# Patient Record
Sex: Male | Born: 1970 | Race: Black or African American | Hispanic: No | Marital: Married | Smoking: Never smoker
Health system: Southern US, Community
[De-identification: ages and names within clinical notes are randomized; demographics above are authoritative.]

## PROBLEM LIST (undated history)

## (undated) DIAGNOSIS — E119 Type 2 diabetes mellitus without complications: Secondary | ICD-10-CM

## (undated) HISTORY — PX: ABDOMINAL SURGERY: SHX537

---

## 2017-02-02 ENCOUNTER — Emergency Department (HOSPITAL_COMMUNITY)
Admission: EM | Admit: 2017-02-02 | Discharge: 2017-02-02 | Disposition: A | Discharge status: Home / Self Care | Payer: Federal, State, Local not specified - PPO | Attending: Emergency Medicine | Admitting: Emergency Medicine

## 2017-02-02 ENCOUNTER — Encounter (HOSPITAL_COMMUNITY): Payer: Self-pay | Admitting: Emergency Medicine

## 2017-02-02 ENCOUNTER — Emergency Department (HOSPITAL_COMMUNITY): Payer: Federal, State, Local not specified - PPO

## 2017-02-02 DIAGNOSIS — E119 Type 2 diabetes mellitus without complications: Secondary | ICD-10-CM | POA: Insufficient documentation

## 2017-02-02 DIAGNOSIS — K529 Noninfective gastroenteritis and colitis, unspecified: Secondary | ICD-10-CM | POA: Insufficient documentation

## 2017-02-02 DIAGNOSIS — R1084 Generalized abdominal pain: Secondary | ICD-10-CM | POA: Diagnosis present

## 2017-02-02 HISTORY — DX: Type 2 diabetes mellitus without complications: E11.9

## 2017-02-02 LAB — CBC
HEMATOCRIT: 44.3 % (ref 39.0–52.0)
HEMOGLOBIN: 15.1 g/dL (ref 13.0–17.0)
MCH: 26.9 pg (ref 26.0–34.0)
MCHC: 34.1 g/dL (ref 30.0–36.0)
MCV: 78.8 fL (ref 78.0–100.0)
Platelets: 216 10*3/uL (ref 150–400)
RBC: 5.62 MIL/uL (ref 4.22–5.81)
RDW: 13.5 % (ref 11.5–15.5)
WBC: 8.6 10*3/uL (ref 4.0–10.5)

## 2017-02-02 LAB — URINALYSIS, ROUTINE W REFLEX MICROSCOPIC
BILIRUBIN URINE: NEGATIVE
Glucose, UA: >=500 mg/dL — AB
KETONES UR: NEGATIVE mg/dL
Nitrite: NEGATIVE
PH: 5 (ref 5.0–8.0)
Specific Gravity, Urine: 1.026 (ref 1.005–1.030)

## 2017-02-02 LAB — COMPREHENSIVE METABOLIC PANEL
ALK PHOS: 70 U/L (ref 38–126)
ALT: 16 U/L — ABNORMAL LOW (ref 17–63)
ANION GAP: 10 (ref 5–15)
AST: 16 U/L (ref 15–41)
Albumin: 3.5 g/dL (ref 3.5–5.0)
BUN: 17 mg/dL (ref 6–20)
CALCIUM: 8.8 mg/dL — AB (ref 8.9–10.3)
CHLORIDE: 98 mmol/L — AB (ref 101–111)
CO2: 24 mmol/L (ref 22–32)
Creatinine, Ser: 1.61 mg/dL — ABNORMAL HIGH (ref 0.61–1.24)
GFR calc non Af Amer: 37 mL/min — ABNORMAL LOW (ref >60)
GFR, EST AFRICAN AMERICAN: 43 mL/min — AB (ref >60)
GLUCOSE: 339 mg/dL — AB (ref 65–99)
POTASSIUM: 4.2 mmol/L (ref 3.5–5.1)
SODIUM: 132 mmol/L — AB (ref 135–145)
Total Bilirubin: 0.7 mg/dL (ref 0.3–1.2)
Total Protein: 7.3 g/dL (ref 6.5–8.1)

## 2017-02-02 LAB — LIPASE, BLOOD: LIPASE: 29 U/L (ref 11–51)

## 2017-02-02 MED ORDER — FAMOTIDINE IN NACL 20-0.9 MG/50ML-% IV SOLN
20.0000 mg | Freq: Once | INTRAVENOUS | Status: AC
  Administered 2017-02-02: 20 mg via INTRAVENOUS
  Filled 2017-02-02: qty 50

## 2017-02-02 MED ORDER — METOCLOPRAMIDE HCL 5 MG/ML IJ SOLN
10.0000 mg | Freq: Once | INTRAMUSCULAR | Status: AC
  Administered 2017-02-02: 10 mg via INTRAVENOUS
  Filled 2017-02-02: qty 2

## 2017-02-02 MED ORDER — SODIUM CHLORIDE 0.9 % IV BOLUS (SEPSIS)
1000.0000 mL | Freq: Once | INTRAVENOUS | Status: AC
  Administered 2017-02-02: 1000 mL via INTRAVENOUS

## 2017-02-02 MED ORDER — OMEPRAZOLE 20 MG PO CPDR: 20.0000 mg | DELAYED_RELEASE_CAPSULE | Freq: Every day | ORAL | 0 refills | Status: AC

## 2017-02-02 MED ORDER — CIPROFLOXACIN HCL 500 MG PO TABS: 500.0000 mg | ORAL_TABLET | Freq: Two times a day (BID) | ORAL | 0 refills | Status: AC

## 2017-02-02 MED ORDER — ONDANSETRON 4 MG PO TBDP: 4.0000 mg | ORAL_TABLET | Freq: Three times a day (TID) | ORAL | 0 refills | Status: AC | PRN

## 2017-02-02 MED ORDER — METRONIDAZOLE 500 MG PO TABS: 500.0000 mg | ORAL_TABLET | Freq: Three times a day (TID) | ORAL | 0 refills | Status: AC

## 2017-02-02 MED ORDER — DICYCLOMINE HCL 20 MG PO TABS: 20.0000 mg | ORAL_TABLET | Freq: Two times a day (BID) | ORAL | 0 refills | Status: DC

## 2017-02-02 MED ORDER — GI COCKTAIL ~~LOC~~
30.0000 mL | Freq: Once | ORAL | Status: AC
  Administered 2017-02-02: 30 mL via ORAL
  Filled 2017-02-02: qty 30

## 2017-02-02 NOTE — ED Provider Notes (Signed)
[facility]-EMERGENCY DEPT Provider Note   CSN: [id number] Arrival date & time: 02/02/17  1201     History   Chief Complaint Chief Complaint  Patient presents with  . Abdominal Pain    HPI Frank Roman is a 46 y.o. male.  HPI 46 year old African-American male past history significant for diabetes presents to the emergency Department today with complaints of abdominal pain. The patient states that he is experiencing generalized abdominal pain for the past 5 days. Patient describes the pain as cramping and sharp in nature. It lasts for a few seconds and self resolved. Patient has had some diarrhea for the past 2-3 days however this has resolved. Patient states that he has not had a bowel movement today. Did report having a bowel movement yesterday without any melena or hematochezia. Patient reports decreased appetite. States that the pain is worse with eating food. Reports history of cholecystectomy. Patient is not taking anything for his symptoms prior to arrival. States the pain is worse with food, palpation, movement. Patient denies any prior history of same. He reports history of chronic NSAID use, recent travel, recent antibiotic use. Patient denies any associated fever, chills, lightheadedness, dizziness, chest pain, shortness of breath, emesis, urinary symptoms, testicular pain or swelling. Past Medical History:  Diagnosis Date  . Diabetes mellitus without complication (HCC)     There are no active problems to display for this patient.   Past Surgical History:  Procedure Laterality Date  . ABDOMINAL SURGERY         Home Medications    Prior to Admission medications   Not on File    Family History No family history on file.  Social History Social History  Substance Use Topics  . Smoking status: Never Smoker  . Smokeless tobacco: Never Used  . Alcohol use Yes     Comment: once per week      Allergies   Patient has no known allergies.   Review of Systems Review of  Systems  Constitutional: Negative for chills and fever.  Eyes: Negative for visual disturbance.  Respiratory: Negative for shortness of breath.   Cardiovascular: Negative for chest pain.  Gastrointestinal: Positive for abdominal pain, diarrhea and nausea. Negative for blood in stool, constipation and vomiting.  Genitourinary: Negative for dysuria, flank pain, frequency, hematuria, scrotal swelling, testicular pain and urgency.  Musculoskeletal: Negative for arthralgias and myalgias.  Skin: Negative for rash.  Neurological: Negative for dizziness, syncope, weakness, light-headedness, numbness and headaches.  Psychiatric/Behavioral: Negative for sleep disturbance. The patient is not nervous/anxious.      Physical Exam Updated Vital Signs BP (!) 150/105   Pulse 91   Temp 98.3 F (36.8 C) (Oral)   Resp 18   Ht  (1.88 m)   Wt (!) 145.2 kg (320 lb)   SpO2 100%   BMI 41.09 kg/m   Physical Exam  Constitutional: He is oriented to person, place, and time. He appears well-developed and well-nourished.  Non-toxic appearance. No distress.  HENT:  Head: Normocephalic and atraumatic.  Mouth/Throat: Oropharynx is clear and moist.  Eyes: Pupils are equal, round, and reactive to light. Conjunctivae are normal. Right eye exhibits no discharge. Left eye exhibits no discharge.  Neck: Normal range of motion. Neck supple.  Cardiovascular: Normal rate, regular rhythm, normal heart sounds and intact distal pulses.  Exam reveals no gallop and no friction rub.   No murmur heard. Pulmonary/Chest: Effort normal and breath sounds normal. No respiratory distress. He exhibits no tenderness.  Abdominal: Soft.  He exhibits no distension. Bowel sounds are increased. There is generalized tenderness and tenderness in the epigastric area, periumbilical area and suprapubic area. There is no rigidity, no rebound, no guarding, no CVA tenderness, no tenderness at McBurney's point and negative Murphy's sign.    Musculoskeletal: Normal range of motion. He exhibits no tenderness.  Lymphadenopathy:    He has no cervical adenopathy.  Neurological: He is alert and oriented to person, place, and time.  Skin: Skin is warm and dry. Capillary refill takes less than 2 seconds. No rash noted.  Psychiatric: His behavior is normal. Judgment and thought content normal.  Nursing note and vitals reviewed.    ED Treatments / Results  Labs (all labs ordered are listed, but only abnormal results are displayed) Labs Reviewed  COMPREHENSIVE METABOLIC PANEL - Abnormal; Notable for the following:       Result Value   Sodium 132 (*)    Chloride 98 (*)    Glucose, Bld 339 (*)    Creatinine, Ser 1.61 (*)    Calcium 8.8 (*)    ALT 16 (*)    GFR calc non Af Amer 37 (*)    GFR calc Af Amer 43 (*)    All other components within normal limits  URINALYSIS, ROUTINE W REFLEX MICROSCOPIC - Abnormal; Notable for the following:    Color, Urine AMBER (*)    APPearance HAZY (*)    Glucose, UA >=500 (*)    Hgb urine dipstick SMALL (*)    Protein, ur >=300 (*)    Leukocytes, UA MODERATE (*)    Bacteria, UA FEW (*)    Squamous Epithelial / LPF 0-5 (*)    All other components within normal limits  URINE CULTURE  LIPASE, BLOOD  CBC    EKG  EKG Interpretation None       Radiology Ct Abdomen Pelvis Wo Contrast  Result Date: 02/02/2017 CLINICAL DATA:  Abdominal pain EXAM: CT ABDOMEN AND PELVIS WITHOUT CONTRAST TECHNIQUE: Multidetector CT imaging of the abdomen and pelvis was performed following the standard protocol without IV contrast. COMPARISON:  None. FINDINGS: Lower chest: The lung bases are clear. No pleural or pericardial effusion. Hepatobiliary: There is no focal liver abnormality. Previous cholecystectomy. No biliary dilatation. Pancreas: The pancreas is unremarkable. Spleen: Normal appearance of the spleen. Adrenals/Urinary Tract: The adrenal glands are normal. Both kidneys are unremarkable. No kidney stone  or hydronephrosis. Urinary bladder appears unremarkable. Stomach/Bowel: The stomach is normal. The small bowel loops have a normal course and caliber. The appendix is not visualized. Wall thickening from the ascending colon through the transverse colon up to the distal descending colon is identified compatible with colitis. Favor inflammatory or infectious in etiology. Although there are numerous diverticulae identified arising from throughout the colon this does not have the typical appearance of diverticulitis. No evidence to suggest bowel perforation. No pneumatosis identified. Unremarkable appearance of the sigmoid colon and rectum. Vascular/Lymphatic: Mild aortic atherosclerosis. No branch vessel disease. No aneurysm. No upper abdominal adenopathy. No pelvic or inguinal adenopathy. Reproductive: Prostate is unremarkable. Other: There is a small periumbilical hernia which contains fat only. There is no free fluid or fluid collections identified within the abdomen or the pelvis. Musculoskeletal: Nonspecific sclerotic lesion within the T11 vertebra is noted measuring 9 mm. In the absence of a known malignancy favor a benign bone island. IMPRESSION: 1. Abnormal wall thickening of the ascending colon through the distal descending colon is identified compatible with colitis. Favor inflammatory or infectious in etiology.  No complicating features identified. 2. 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 3.  Aortic Atherosclerosis (ICD10-I70.0). Electronically Signed   By: Signa Kell M.D.   On: 02/02/2017 19:17    Procedures Procedures (including critical care time)  Medications Ordered in ED Medications  metoCLOPramide (REGLAN) injection 10 mg (10 mg Intravenous Given 02/02/17 1818)  famotidine (PEPCID) IVPB 20 mg premix (0 mg Intravenous Stopped 02/02/17 1849)  gi cocktail (Maalox,Lidocaine,Donnatal) (30 mLs Oral Given 02/02/17 1818)  sodium chloride 0.9 % bolus 1,000  mL (0 mLs Intravenous Stopped 02/02/17 2112)     Initial Impression / Assessment and Plan / ED Course  I have reviewed the triage vital signs and the nursing notes.  Pertinent labs & imaging results that were available during my care of the patient were reviewed by me and considered in my medical decision making (see chart for details).     The patient presents to the ED with complaints of 4-5 days of generalized abdominal pain. States he notices the pain more after he eats. Reports associated decrease in appetite, diarrhea, nausea. Denies any emesis, fever, bloody stools. Denies any urinary symptoms.  On exam patient is overall well-appearing and nontoxic. Vital signs are reassuring. Patient is afebrile, no tachycardia, no hypotension. Patient is hypertensive however he did not take his blood pressure medicine this evening.  No leukocytosis is noted. Mild elevation in creatinine of 1.6 unknown baseline. Hyperglycemia noted of 339. Normal corrected sodium. Lipase normal. UA with moderate leukocytes, numerous wbc's, rbc's, few bacteria. The patient denies any urinary symptoms. Possibly contaminated however will send for culture.  On exam patient does have generalized abdominal pain to palpation but no signs of peritonitis. Lungs clear to auscultation bilaterally. Heart without any murmur rubs or gallops.  Given patient's history of diabetes, for a history of abdominal pain and diarrhea CT scan was performed.  Ct scan showed;  1. Abnormal wall thickening of the ascending colon through the distal descending colon is identified compatible with colitis. Favor inflammatory or infectious in etiology. No complicating features identified. 2. 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 3.  Aortic Atherosclerosis. 4. Nonspecific sclerotic lesion within the T11 vertebra is noted measuring 9 mm. In the absence of a known malignancy favor a benign bone island.   Given  CT findings of colitis either infectious or inflammatory will start patient on Cipro and Flagyl. This will cover for uti. Encourage patient that if symptoms do not improve with antibiotics may need GI follow-up with colonoscopy to rule out inflammatory processes. Patient tolerating by mouth fluids in the ED.  Vital signs remained reassuring. Repeat abdominal exam is benign and shows no signs of peritonitis. Does not meet Sirs or SEPSIS criteria.   Pt pain managed in the ED. Feels much improved.   Dicussed ct findings with pt concerning aortic atherosclerosis. Patient states that he did have episode of chest pain one half weeks ago. States that it felt like gas. Has had no further episodes. Offered patient further workup with EKG, troponin, chest x-ray. Patient states that he does not want this at this time. We'll follow up with his primary care doctor if his symptoms persist.  Pt is hemodynamically stable, in NAD, & able to ambulate in the ED. Evaluation does not show pathology that would require ongoing emergent intervention or inpatient treatment. I explained the diagnosis to the patient. Pain has been managed & has no complaints prior to dc. Pt is comfortable with above plan  and is stable for discharge at this time. All questions were answered prior to disposition. Strict return precautions for f/u to the ED were discussed. Encouraged follow up with PCP.  Dicussed with Dr. Silverio Lay who is agreeable to the above plan.      Final Clinical Impressions(s) / ED Diagnoses   Final diagnoses:  Colitis    New Prescriptions New Prescriptions   No medications on file     Wallace Keller 02/03/17 0056    Rise Mu, PA-C 02/03/17 0056    Charlynne Pander, MD 02/03/17 [phone]

## 2017-02-02 NOTE — Discharge Instructions (Signed)
Your CT scan does show signs of colitis. Please start taking the antibiotics as prescribed. Have given you bentyl which else for stomach cramping. Use the Zofran sparingly as needed for nausea. Start taking Prilosec once a day for acid reflux. Drink plenty of fluids to stay hydrated. You kidney function was slightly elevated. Have discussed with her CT findings. Follow-up with her primary care doctor as needed. Return to ED if you develop fevers, worsening vomiting, bloody stool or for any other reason.

## 2017-02-02 NOTE — ED Notes (Signed)
Discharge instructions reviewed with patient. Patient verbalizes understanding. VSS.   

## 2017-02-02 NOTE — ED Triage Notes (Signed)
Patient reports intermittent abd pain over the past 4-5 days that notices more after he eats. But hasnt been eating as much as normal due to not having appetite. patient reports had diarrhea that stopped yesterday.

## 2017-02-04 LAB — URINE CULTURE: Culture: <10000 — AB

## 2017-12-31 ENCOUNTER — Emergency Department (HOSPITAL_COMMUNITY): Payer: Federal, State, Local not specified - PPO

## 2017-12-31 ENCOUNTER — Other Ambulatory Visit: Payer: Self-pay

## 2017-12-31 ENCOUNTER — Encounter (HOSPITAL_COMMUNITY): Payer: Self-pay | Admitting: Emergency Medicine

## 2017-12-31 ENCOUNTER — Emergency Department (HOSPITAL_COMMUNITY)
Admission: EM | Admit: 2017-12-31 | Discharge: 2017-12-31 | Disposition: A | Discharge status: Home / Self Care | Payer: Federal, State, Local not specified - PPO | Attending: Emergency Medicine | Admitting: Emergency Medicine

## 2017-12-31 DIAGNOSIS — Z7984 Long term (current) use of oral hypoglycemic drugs: Secondary | ICD-10-CM | POA: Insufficient documentation

## 2017-12-31 DIAGNOSIS — E119 Type 2 diabetes mellitus without complications: Secondary | ICD-10-CM | POA: Diagnosis not present

## 2017-12-31 DIAGNOSIS — R1032 Left lower quadrant pain: Secondary | ICD-10-CM | POA: Insufficient documentation

## 2017-12-31 DIAGNOSIS — R197 Diarrhea, unspecified: Secondary | ICD-10-CM | POA: Diagnosis not present

## 2017-12-31 DIAGNOSIS — Z79899 Other long term (current) drug therapy: Secondary | ICD-10-CM | POA: Diagnosis not present

## 2017-12-31 DIAGNOSIS — R112 Nausea with vomiting, unspecified: Secondary | ICD-10-CM | POA: Insufficient documentation

## 2017-12-31 LAB — URINALYSIS, ROUTINE W REFLEX MICROSCOPIC
Bilirubin Urine: NEGATIVE
GLUCOSE, UA: NEGATIVE mg/dL
KETONES UR: NEGATIVE mg/dL
NITRITE: NEGATIVE
PH: 5 (ref 5.0–8.0)
PROTEIN: 100 mg/dL — AB
Specific Gravity, Urine: 1.014 (ref 1.005–1.030)

## 2017-12-31 LAB — COMPREHENSIVE METABOLIC PANEL
ALBUMIN: 3.5 g/dL (ref 3.5–5.0)
ALT: 13 U/L (ref 0–44)
ANION GAP: 12 (ref 5–15)
AST: 16 U/L (ref 15–41)
Alkaline Phosphatase: 67 U/L (ref 38–126)
BUN: 17 mg/dL (ref 6–20)
CHLORIDE: 99 mmol/L (ref 98–111)
CO2: 27 mmol/L (ref 22–32)
Calcium: 9.1 mg/dL (ref 8.9–10.3)
Creatinine, Ser: 1.77 mg/dL — ABNORMAL HIGH (ref 0.61–1.24)
GFR calc Af Amer: 50 mL/min — ABNORMAL LOW (ref >60)
GFR calc non Af Amer: 43 mL/min — ABNORMAL LOW (ref >60)
GLUCOSE: 185 mg/dL — AB (ref 70–99)
POTASSIUM: 3.9 mmol/L (ref 3.5–5.1)
SODIUM: 138 mmol/L (ref 135–145)
Total Bilirubin: 0.5 mg/dL (ref 0.3–1.2)
Total Protein: 7.2 g/dL (ref 6.5–8.1)

## 2017-12-31 LAB — LIPASE, BLOOD: Lipase: 34 U/L (ref 11–51)

## 2017-12-31 LAB — CBC WITH DIFFERENTIAL/PLATELET
Basophils Absolute: 0 10*3/uL (ref 0.0–0.1)
Basophils Relative: 0 %
Eosinophils Absolute: 0.5 10*3/uL (ref 0.0–0.7)
Eosinophils Relative: 5 %
HEMATOCRIT: 45.1 % (ref 39.0–52.0)
HEMOGLOBIN: 14.8 g/dL (ref 13.0–17.0)
Lymphocytes Relative: 22 %
Lymphs Abs: 1.9 10*3/uL (ref 0.7–4.0)
MCH: 26.8 pg (ref 26.0–34.0)
MCHC: 32.8 g/dL (ref 30.0–36.0)
MCV: 81.7 fL (ref 78.0–100.0)
MONOS PCT: 7 %
Monocytes Absolute: 0.7 10*3/uL (ref 0.1–1.0)
NEUTROS ABS: 6 10*3/uL (ref 1.7–7.7)
NEUTROS PCT: 66 %
Platelets: 226 10*3/uL (ref 150–400)
RBC: 5.52 MIL/uL (ref 4.22–5.81)
RDW: 14.1 % (ref 11.5–15.5)
WBC: 9 10*3/uL (ref 4.0–10.5)

## 2017-12-31 MED ORDER — HYDROCODONE-ACETAMINOPHEN 5-325 MG PO TABS
1.0000 | ORAL_TABLET | Freq: Once | ORAL | Status: DC
  Filled 2017-12-31: qty 1

## 2017-12-31 MED ORDER — IOHEXOL 300 MG/ML  SOLN
30.0000 mL | Freq: Once | INTRAMUSCULAR | Status: AC | PRN
  Administered 2017-12-31: 30 mL via ORAL

## 2017-12-31 MED ORDER — DICYCLOMINE HCL 10 MG PO CAPS
10.0000 mg | ORAL_CAPSULE | Freq: Once | ORAL | Status: AC
  Administered 2017-12-31: 10 mg via ORAL
  Filled 2017-12-31: qty 1

## 2017-12-31 MED ORDER — DICYCLOMINE HCL 20 MG PO TABS: 20.0000 mg | ORAL_TABLET | Freq: Two times a day (BID) | ORAL | 0 refills | Status: AC

## 2017-12-31 MED ORDER — ONDANSETRON HCL 4 MG/2ML IJ SOLN
4.0000 mg | Freq: Once | INTRAMUSCULAR | Status: AC
  Administered 2017-12-31: 4 mg via INTRAVENOUS
  Filled 2017-12-31: qty 2

## 2017-12-31 MED ORDER — SODIUM CHLORIDE 0.9 % IV BOLUS
1000.0000 mL | Freq: Once | INTRAVENOUS | Status: AC
  Administered 2017-12-31: 1000 mL via INTRAVENOUS

## 2017-12-31 MED ORDER — MORPHINE SULFATE (PF) 4 MG/ML IV SOLN
4.0000 mg | Freq: Once | INTRAVENOUS | Status: AC
  Administered 2017-12-31: 4 mg via INTRAVENOUS
  Filled 2017-12-31: qty 1

## 2017-12-31 MED ORDER — ONDANSETRON HCL 4 MG PO TABS: 4.0000 mg | ORAL_TABLET | Freq: Three times a day (TID) | ORAL | 0 refills | Status: AC | PRN

## 2017-12-31 NOTE — ED Provider Notes (Signed)
[facility]-EMERGENCY DEPT Provider Note   CSN: [id number] Arrival date & time: 12/31/17  [id number]     History   Chief Complaint Chief Complaint  Patient presents with  . Abdominal Pain    HPI Frank Roman is a 47 y.o. male with a history of diabetes who presents emergency department today for abdominal pain.  Patient reports that he has been having lower abdominal pain is worse on the left side over the last 3-4 days.  He reports that this typically occurs after eating and lasts for several hours.  He reports that it is like a band across his lower abdomen and he describes as a irritating feeling.  He reports that he had associated nausea and one episode of nonbilious, nonbloody emesis last night and also this morning.  Patient reports that he has had several episodes of nonbloody, non-melanous diarrhea since the start of this as well.  He reports his last episode was last night.  Patient denies similar pain in the past.  He denies any sick contacts.  No recent travel.  No new or suspicious food.  No recent antibiotic use.  He denies any fever, chills, upper abdominal pain, cp, sob, cough, testicular pain/swelling, flank pain, or urinary symptoms.  He has had a prior cholecystectomy.  He has never had a colonoscopy.  He denies history of diverticulitis. No chronic nsaid use. Notes occasional alcohol use but denies use recently.   HPI  Past Medical History:  Diagnosis Date  . Diabetes mellitus without complication (HCC)     There are no active problems to display for this patient.   Past Surgical History:  Procedure Laterality Date  . ABDOMINAL SURGERY          Home Medications    Prior to Admission medications   Medication Sig Start Date End Date Taking? Authorizing Provider  amLODipine (NORVASC) 10 MG tablet TK 1 T PO BID 12/09/16   [provider]  ciprofloxacin (CIPRO) 500 MG tablet Take 1 tablet (500 mg total) by mouth 2 (two) times daily. 02/02/17    Rise MuLeaphart, Kenneth T, PA-C  clobetasol ointment (TEMOVATE) 0.05 % APP EXT AA BID 12/21/16   [provider]  dicyclomine (BENTYL) 20 MG tablet Take 1 tablet (20 mg total) by mouth 2 (two) times daily. 02/02/17   Leaphart, Lynann BeaverKenneth T, PA-C  JANUVIA 100 MG tablet TK 1 T PO QD 01/07/17   [provider]  metFORMIN (GLUCOPHAGE-XR) 500 MG 24 hr tablet TK 1 T PO QD WITH THE EVE MEAL 01/07/17   [provider]  metroNIDAZOLE (FLAGYL) 500 MG tablet Take 1 tablet (500 mg total) by mouth 3 (three) times daily. DO NOT CONSUME ALCOHOL WHILE TAKING THIS MEDICATION. 02/02/17   Rise MuLeaphart, Kenneth T, PA-C  omeprazole (PRILOSEC) 20 MG capsule Take 1 capsule (20 mg total) by mouth daily. 02/02/17   Demetrios LollLeaphart, Kenneth T, PA-C  ondansetron (ZOFRAN-ODT) 4 MG disintegrating tablet Take 1 tablet (4 mg total) by mouth every 8 (eight) hours as needed for nausea. 02/02/17   Rise MuLeaphart, Kenneth T, PA-C  sildenafil (VIAGRA) 50 MG tablet TK 1 T PO 1 HOUR BEFORE SEXUAL ACTIVITY 01/08/17   [provider]    Family History Family History  Problem Relation Age of Onset  . Hypertension Other     Social History Social History   Tobacco Use  . Smoking status: Never Smoker  . Smokeless tobacco: Never Used  Substance Use Topics  . Alcohol use: Yes  .  Drug use: Never     Allergies   Patient has no known allergies.   Review of Systems Review of Systems  All other systems reviewed and are negative.    Physical Exam Updated Vital Signs BP (!) 150/96   Pulse 63   Temp 98.1 F (36.7 C) (Oral)   Resp 16   Ht 6\' 2"  (1.88 m)   Wt (!) 158.8 kg   SpO2 100%   BMI 44.94 kg/m   Physical Exam  Constitutional: He appears well-developed and well-nourished.  HENT:  Head: Normocephalic and atraumatic.  Right Ear: External ear normal.  Left Ear: External ear normal.  Nose: Nose normal.  Mouth/Throat: Uvula is midline, oropharynx is clear and moist and mucous membranes are normal. No tonsillar  exudate.  Eyes: Pupils are equal, round, and reactive to light. Right eye exhibits no discharge. Left eye exhibits no discharge. No scleral icterus.  Neck: Trachea normal. Neck supple. No spinous process tenderness present. No neck rigidity. Normal range of motion present.  Cardiovascular: Normal rate, regular rhythm and intact distal pulses.  No murmur heard. Pulses:      Radial pulses are 2+ on the right side, and 2+ on the left side.       Dorsalis pedis pulses are 2+ on the right side, and 2+ on the left side.       Posterior tibial pulses are 2+ on the right side, and 2+ on the left side.  No lower extremity swelling or edema. Calves symmetric in size bilaterally.  Pulmonary/Chest: Effort normal and breath sounds normal. He exhibits no tenderness.  Abdominal: Soft. Bowel sounds are normal. There is tenderness in the suprapubic area and left lower quadrant. There is no rigidity, no rebound, no guarding, no CVA tenderness, no tenderness at McBurney's point and negative Murphy's sign.  Musculoskeletal: He exhibits no edema.  Lymphadenopathy:    He has no cervical adenopathy.  Neurological: He is alert.  Skin: Skin is warm and dry. No rash noted. He is not diaphoretic.  Psychiatric: He has a normal mood and affect.  Nursing note and vitals reviewed.    ED Treatments / Results  Labs (all labs ordered are listed, but only abnormal results are displayed) Labs Reviewed  COMPREHENSIVE METABOLIC PANEL - Abnormal; Notable for the following components:      Result Value   Glucose, Bld 185 (*)    Creatinine, Ser 1.77 (*)    GFR calc non Af Amer 43 (*)    GFR calc Af Amer 50 (*)    All other components within normal limits  URINALYSIS, ROUTINE W REFLEX MICROSCOPIC - Abnormal; Notable for the following components:   Hgb urine dipstick SMALL (*)    Protein, ur 100 (*)    Leukocytes, UA TRACE (*)    Bacteria, UA RARE (*)    All other components within normal limits  URINE CULTURE  LIPASE,  BLOOD  CBC WITH DIFFERENTIAL/PLATELET    EKG None  Radiology Ct Abdomen Pelvis Wo Contrast  Result Date: 12/31/2017 CLINICAL DATA:  Abdominal pain and vomiting EXAM: CT ABDOMEN AND PELVIS WITHOUT CONTRAST TECHNIQUE: Multidetector CT imaging of the abdomen and pelvis was performed following the standard protocol without IV contrast. COMPARISON:  02/02/2017 FINDINGS: Lower chest: No acute abnormality. Hepatobiliary: No focal liver abnormality is seen. Status post cholecystectomy. No biliary dilatation. Pancreas: Unremarkable. No pancreatic ductal dilatation or surrounding inflammatory changes. Spleen: Normal in size without focal abnormality. Adrenals/Urinary Tract: Adrenal glands are stable in appearance.  The kidneys are well visualized bilaterally without renal calculi or obstructive change. The ureters are within normal limits. The bladder is partially distended. Stomach/Bowel: Diverticular change of the colon is noted diffusely. No evidence of diverticulitis is seen. The appendix is not well visualized although no inflammatory changes to suggest appendicitis are seen. No obstructive or inflammatory changes are noted. Vascular/Lymphatic: Aortic atherosclerosis. No enlarged abdominal or pelvic lymph nodes. Reproductive: Prostate is unremarkable. Other: No abdominal wall hernia or abnormality. No abdominopelvic ascites. Musculoskeletal: No acute or significant osseous findings. IMPRESSION: Diverticulosis without evidence of diverticulitis. No other focal abnormality is noted. Electronically Signed   By: Alcide Clever M.D.   On: 12/31/2017 11:20    Procedures Procedures (including critical care time)  Medications Ordered in ED Medications  HYDROcodone-acetaminophen (NORCO/VICODIN) 5-325 MG per tablet 1 tablet (has no administration in time range)  morphine 4 MG/ML injection 4 mg (4 mg Intravenous Given 12/31/17 0756)  ondansetron (ZOFRAN) injection 4 mg (4 mg Intravenous Given 12/31/17 0754)  sodium  chloride 0.9 % bolus 1,000 mL (0 mLs Intravenous Stopped 12/31/17 0946)  iohexol (OMNIPAQUE) 300 MG/ML solution 30 mL (30 mLs Oral Contrast Given 12/31/17 0816)     Initial Impression / Assessment and Plan / ED Course  I have reviewed the triage vital signs and the nursing notes.  Pertinent labs & imaging results that were available during my care of the patient were reviewed by me and considered in my medical decision making (see chart for details).     47 y.o. male presents with intermittent lower abdominal pain is worse in the left lower quadrant with associated nausea, vomiting, diarrhea.  He denies any fevers at home.  He has had a prior areas cholecystectomy.  Denies any recent travel, sick contacts or antibiotic use.  Vital signs are reassuring on presentation.  Patient does not meet sepsis criteria.  Patient denies any urinary symptoms.  Abdominal exam without peritoneal signs.  Labs, imaging ordered.  Patient given IV fluid, nausea and pain medication.  Patient without leukocytosis.  No anemia.  Lipase within normal limits.  Creatinine elevated at baseline.  No evidence of DKA.  LFTs within normal limits.  No significant electrolyte derangements.  UA with trace leukocytes and 6-10 white blood cells.  He denies any urinary symptoms.  Will culture.  CT scan with diverticulosis without diverticulitis, abscess, obstruction, appendicitis or other abnormality.  Repeat abdominal exam without tenderness or peritoneal signs.  Patient's pain is currently controlled.  He is without vomiting in the department.  No further work-up indicated at this time.  Plan for discharge home with conservative therapy.  Patient recommended to have follow-up with GI in the next 1 year as he may benefit from colonoscopy in the near future given his age.  He is to follow with PCP this week.  Return precautions discussed.  Patient appears safe for discharge.   Final Clinical Impressions(s) / ED Diagnoses   Final  diagnoses:  Left lower quadrant pain  Nausea vomiting and diarrhea    ED Discharge Orders         Ordered    dicyclomine (BENTYL) 20 MG tablet  2 times daily     12/31/17 1158    ondansetron (ZOFRAN) 4 MG tablet  Every 8 hours PRN     12/31/17 1158           Princella Pellegrini 12/31/17 1204    Azalia Bilis, MD 12/31/17 [phone]

## 2017-12-31 NOTE — Discharge Instructions (Addendum)
Please read and follow all provided instructions You have been seen today for your complaint of: abdominal pain Your lab work: was reassuring Your imaging: showed diverticulosis Vital signs: See below  Abdominal Pain  Your exam might not show the exact reason you have abdominal pain. Since there are many different causes of abdominal pain, another checkup and more tests may be needed. It is very important to follow up for lasting (persistent) or worsening symptoms. A possible cause of abdominal pain in any person who still has his or her appendix is acute appendicitis. Appendicitis is often hard to diagnose. Normal blood tests, urine tests, ultrasound, and CT scans do not completely rule out early appendicitis or other causes of abdominal pain. Sometimes, only the changes that happen over time will allow appendicitis and other causes of abdominal pain to be determined. Other potential problems that may require surgery may also take time to become more apparent. Because of this, it is important that you follow all of the instructions below.   HOME CARE INSTRUCTIONS  Do not take laxatives unless directed by your caregiver. Rest as much as possible.  Do not eat solid food until your pain is gone: A diet of water, weak decaffeinated tea, broth or bouillon, gelatin, oral rehydration solutions (ORS), frozen ice pops, or ice chips may be helpful.  When pain is gone: Start a light diet (dry toast, crackers, applesauce, or white rice). Increase the diet slowly as long as it does not bother you. Eat no dairy products (including cheese and eggs) and no spicy, fatty, fried, or high-fiber foods.  Use no alcohol, caffeine, or cigarettes.  Take your regular medicines unless your caregiver told you not to.  Take any prescribed medicine as directed.   SEEK IMMEDIATE MEDICAL CARE IF:  The pain does not go away.  You have a fever >101 that does not go down with medication. You keep throwing up (vomiting) or cannot  drink liquids.  You have shaking chills (rigors) The pain becomes localized (Pain in the right side could possibly be appendicitis. In an adult, pain in the left lower portion of the abdomen could be colitis or diverticulitis). You pass bloody or black tarry stools.  There is bright red blood in the stool.  There is blood in your vomit. Your bowel movements stop (become blocked) or you cannot pass gas.  The constipation stays for more than 4 days.  You have bloody, frequent, or painful urination.  You have yellow discoloration in the skin or whites of the eyes.  Your stomach becomes bloated or bigger.  You have dizziness or fainting.  You have chest or back pain. You have rectal pain.  You do not seem to be getting better.  You have any questions or concerns.   You may benefit from a bland diet. Please see attached handout.  You may benefit from a colonoscopy in the near future.  Your vital signs today were: BP 132/90    Pulse 65    Temp 98.1 F (36.7 C) (Oral)    Resp 16    Ht 6\' 2"  (1.88 m)    Wt (!) 158.8 kg    SpO2 100%    BMI 44.94 kg/m  If your blood pressure (bp) was elevated above 135/85 this visit, please have this repeated by your doctor within one month.

## 2017-12-31 NOTE — ED Triage Notes (Signed)
Pt is c/o upset stomach  Pt states he has pain that comes and goes for the past 4 days  States it is just an irritating pain  Pt states he had a small amount of vomiting this morning

## 2018-01-02 LAB — URINE CULTURE

## 2018-04-18 ENCOUNTER — Emergency Department (HOSPITAL_BASED_OUTPATIENT_CLINIC_OR_DEPARTMENT_OTHER): Payer: Federal, State, Local not specified - PPO

## 2018-04-18 ENCOUNTER — Emergency Department (HOSPITAL_COMMUNITY)
Admission: EM | Admit: 2018-04-18 | Discharge: 2018-04-18 | Disposition: A | Discharge status: Home / Self Care | Payer: Federal, State, Local not specified - PPO | Attending: Emergency Medicine | Admitting: Emergency Medicine

## 2018-04-18 ENCOUNTER — Other Ambulatory Visit: Payer: Self-pay

## 2018-04-18 ENCOUNTER — Encounter (HOSPITAL_COMMUNITY): Payer: Self-pay

## 2018-04-18 DIAGNOSIS — R609 Edema, unspecified: Secondary | ICD-10-CM | POA: Diagnosis not present

## 2018-04-18 DIAGNOSIS — I10 Essential (primary) hypertension: Secondary | ICD-10-CM | POA: Insufficient documentation

## 2018-04-18 DIAGNOSIS — M7989 Other specified soft tissue disorders: Secondary | ICD-10-CM

## 2018-04-18 DIAGNOSIS — E119 Type 2 diabetes mellitus without complications: Secondary | ICD-10-CM | POA: Diagnosis not present

## 2018-04-18 DIAGNOSIS — Z79899 Other long term (current) drug therapy: Secondary | ICD-10-CM | POA: Insufficient documentation

## 2018-04-18 DIAGNOSIS — R52 Pain, unspecified: Secondary | ICD-10-CM | POA: Diagnosis not present

## 2018-04-18 DIAGNOSIS — M79605 Pain in left leg: Secondary | ICD-10-CM | POA: Diagnosis not present

## 2018-04-18 NOTE — ED Provider Notes (Signed)
Care [facility] PA-C at shift change.  Per her HPI, " 47 year old male with a history of diabetes and hypertension presents to the emergency department for evaluation of lower extremity swelling.  He states that he has experienced lower extremity edema for the past 3 weeks.  Initially saw his primary care doctor who started him on hydrochlorothiazide.  He has been compliant with this medication, but feels that his swelling is worsening, specifically in his right lower extremity.  When for follow-up with his doctor 2 days ago who stated that he may have a blood clot.  Denies any chest pain, shortness of breath.  States that the swelling in his legs does improve slightly when elevated.  He has not had any trauma, injury, fevers.  No recent surgeries or hospitalizations."   Physical Exam  BP (!) 153/89   Pulse (!) 57   Temp 98.9 F (37.2 C) (Oral)   Resp 15   SpO2 99%   Physical Exam  Constitutional: He is oriented to person, place, and time. He appears well-developed and well-nourished. No distress.  Eyes: Conjunctivae are normal.  Cardiovascular: Normal rate.  Pulmonary/Chest: Effort normal.  Musculoskeletal: He exhibits edema (pitting, bilat).  No signs of cellulitis to BLE  Neurological: He is alert and oriented to person, place, and time.  Skin: Skin is warm and dry.    ED Course/Procedures      Bilateral lower extremity venous duplex completed. Bilateral lower extremities are negative for deep vein thrombosis. There is no evidence of Baker'Roman cyst bilaterally.  04/18/2018 8:58 AM Frank FeyMichelle Roman, MHA, RVT, RDCS, RDMS   Procedures  MDM   Patient presenting with complaints of bilateral lower extremity edema.  Has been seen by PCP for this in the past and is currently on hydrochlorothiazide.  Presenting today due to concern of possible DVT.  Bilateral lower extremity venous duplexes were completed and were both negative for DVT or Baker'Roman cyst.  Discussed findings  with the patient and advised him to continue hydrochlorothiazide at home.  Advised elevation of the legs as well as compression stockings.  Advised him to follow-up with PCP in regards to dosing of HCTZ.  Advised him to return to the ER for new or worsening symptoms in the meantime.  Patient voiced understanding the plan and reasons return.  All questions answered.       Frank Roman, Frank Muller S, PA-C 04/18/18 [id number]    Frank Roman, Douglas, MD 04/18/18 1515

## 2018-04-18 NOTE — ED Notes (Signed)
Pt ambulatory to the restroom with no assistance.  

## 2018-04-18 NOTE — ED Triage Notes (Signed)
Pt reports swelling and pain in his R calf that started about 3 weeks ago. He is concerned about a blood clot. He states that it feels like it gets worse every day. Area is swollen and discolored. He denies any SOB or chest pain. A&Ox4. Ambulatory. Hx of HTN and DM2.

## 2018-04-18 NOTE — ED Provider Notes (Signed)
[facility]-EMERGENCY DEPT Provider Note   CSN: [id number] Arrival date & time: 04/18/18  0108     History   Chief Complaint Chief Complaint  Patient presents with  . Leg Swelling    R calf    HPI Frank Roman is a 47 y.o. male.   47 year old male with a history of diabetes and hypertension presents to the emergency department for evaluation of lower extremity swelling.  He states that he has experienced lower extremity edema for the past 3 weeks.  Initially saw his primary care doctor who started him on hydrochlorothiazide.  He has been compliant with this medication, but feels that his swelling is worsening, specifically in his right lower extremity.  When for follow-up with his doctor 2 days ago who stated that he may have a blood clot.  Denies any chest pain, shortness of breath.  States that the swelling in his legs does improve slightly when elevated.  He has not had any trauma, injury, fevers.  No recent surgeries or hospitalizations.     Past Medical History:  Diagnosis Date  . Diabetes mellitus without complication (HCC)     There are no active problems to display for this patient.   Past Surgical History:  Procedure Laterality Date  . ABDOMINAL SURGERY          Home Medications    Prior to Admission medications   Medication Sig Start Date End Date Taking? Authorizing Provider  amLODipine (NORVASC) 10 MG tablet Take 10 mg by mouth daily.  12/09/16  Yes [provider]  atenolol (TENORMIN) 50 MG tablet Take 50 mg by mouth daily. 11/08/17  Yes [provider]  atorvastatin (LIPITOR) 10 MG tablet Take 10 mg by mouth daily. 11/08/17  Yes [provider]  clobetasol ointment (TEMOVATE) 0.05 % Apply 1 application topically daily as needed (rash).  12/21/16  Yes [provider]  glyBURIDE (DIABETA) 5 MG tablet Take 5 mg by mouth daily before breakfast. 11/12/17  Yes [provider]  hydrochlorothiazide  (HYDRODIURIL) 25 MG tablet Take 25 mg by mouth every morning. 12/01/17  Yes [provider]  metFORMIN (GLUCOPHAGE-XR) 500 MG 24 hr tablet Take 500 mg by mouth daily with breakfast.  01/07/17  Yes [provider]  ciprofloxacin (CIPRO) 500 MG tablet Take 1 tablet (500 mg total) by mouth 2 (two) times daily. Patient not taking: Reported on 12/31/2017 02/02/17   Demetrios LollLeaphart, Kenneth T, PA-C  dicyclomine (BENTYL) 20 MG tablet Take 1 tablet (20 mg total) by mouth 2 (two) times daily. Patient not taking: Reported on 04/18/2018 12/31/17   Maczis, Elmer SowMichael M, PA-C  metroNIDAZOLE (FLAGYL) 500 MG tablet Take 1 tablet (500 mg total) by mouth 3 (three) times daily. DO NOT CONSUME ALCOHOL WHILE TAKING THIS MEDICATION. Patient not taking: Reported on 12/31/2017 02/02/17   Demetrios LollLeaphart, Kenneth T, PA-C  omeprazole (PRILOSEC) 20 MG capsule Take 1 capsule (20 mg total) by mouth daily. Patient not taking: Reported on 12/31/2017 02/02/17   Demetrios LollLeaphart, Kenneth T, PA-C  ondansetron (ZOFRAN) 4 MG tablet Take 1 tablet (4 mg total) by mouth every 8 (eight) hours as needed for nausea or vomiting. Patient not taking: Reported on 04/18/2018 12/31/17   Maczis, Elmer SowMichael M, PA-C  ondansetron (ZOFRAN-ODT) 4 MG disintegrating tablet Take 1 tablet (4 mg total) by mouth every 8 (eight) hours as needed for nausea. Patient not taking: Reported on 12/31/2017 02/02/17   Demetrios LollLeaphart, Kenneth T, PA-C  sildenafil (VIAGRA) 100 MG tablet Take 100  mg by mouth daily as needed. 12/18/17   [provider]    Family History Family History  Problem Relation Age of Onset  . Hypertension Other     Social History Social History   Tobacco Use  . Smoking status: Never Smoker  . Smokeless tobacco: Never Used  Substance Use Topics  . Alcohol use: Yes  . Drug use: Never     Allergies   Patient has no known allergies.   Review of Systems Review of Systems Ten systems reviewed and are negative for acute change, except as noted in the  HPI.    Physical Exam Updated Vital Signs BP 138/85   Pulse 63   Temp 98.9 F (37.2 C) (Oral)   Resp 16   SpO2 100%   Physical Exam  Constitutional: He is oriented to person, place, and time. He appears well-developed and well-nourished. No distress.  Nontoxic appearing and in NAD  HENT:  Head: Normocephalic and atraumatic.  Eyes: Conjunctivae and EOM are normal. No scleral icterus.  Neck: Normal range of motion.  Cardiovascular: Normal rate, regular rhythm and intact distal pulses.  DP pulse 2+ bilaterally.  Pulmonary/Chest: Effort normal. No stridor. No respiratory distress.  Respirations even and unlabored  Musculoskeletal: Normal range of motion.  BLE edema, pitting, 2+. No erythema, heat to touch, drainage.  Neurological: He is alert and oriented to person, place, and time. He exhibits normal muscle tone. Coordination normal.  Skin: Skin is warm and dry. No rash noted. He is not diaphoretic. No erythema. No pallor.  Psychiatric: He has a normal mood and affect. His behavior is normal.  Nursing note and vitals reviewed.    ED Treatments / Results  Labs (all labs ordered are listed, but only abnormal results are displayed) Labs Reviewed - No data to display  EKG None  Radiology No results found.  Procedures Procedures (including critical care time)  Medications Ordered in ED Medications - No data to display   Initial Impression / Assessment and Plan / ED Course  I have reviewed the triage vital signs and the nursing notes.  Pertinent labs & imaging results that were available during my care of the patient were reviewed by me and considered in my medical decision making (see chart for details).     47 year old male presents for peripheral edema pressing concern for a blood clot.  He has no complaints of chest pain or shortness of breath.  Noted to have 2+ pitting edema in bilateral lower extremities; suspected uncomplicated dependent edema.  Pending  ultrasound for further evaluation.  Patient signed out to [employer], PA-C who will follow up on imaging and disposition appropriately.   Final Clinical Impressions(s) / ED Diagnoses   Final diagnoses:  Peripheral edema    ED Discharge Orders    None       Antony Madura, PA-C 04/18/18 1610    Zadie Rhine, MD 04/18/18 [phone]

## 2018-04-18 NOTE — ED Notes (Signed)
US at bedside

## 2018-04-18 NOTE — ED Notes (Signed)
Pt updated on plan of care. Pt informed US would be in shortly.

## 2018-04-18 NOTE — ED Notes (Signed)
Pt denies need for WC. Pt has been ambulatory during ED visit.

## 2018-04-18 NOTE — Discharge Instructions (Signed)
The ultrasounds of your legs were negative for a blood clot today.  Please continue taking your diuretic at home.  You may also purchase over-the-counter compression stockings to help reduce the swelling to your legs.  Please make an appointment with your regular doctor to discuss the dosing of your fluid pill and to be reevaluated in 1 week.  Please return to the emergency department for any new or worsening symptoms in the meantime.

## 2018-04-18 NOTE — Progress Notes (Signed)
*  Preliminary Results* Bilateral lower extremity venous duplex completed. Bilateral lower extremities are negative for deep vein thrombosis. There is no evidence of Baker's cyst bilaterally.  04/18/2018 8:58 AM Gertie FeyMichelle Davione Lenker, MHA, RVT, RDCS, RDMS

## 2019-01-24 IMAGING — CT CT ABD-PELV W/O CM
2 of 4 series · 16 of 46 positions shown, 18 images · non-contrast
Comparison: None.

CLINICAL DATA: Abdominal pain

EXAM:
CT ABDOMEN AND PELVIS WITHOUT CONTRAST
TECHNIQUE: Multidetector CT imaging of the abdomen and pelvis was performed
following the standard protocol without IV contrast.

[Series 2: abd/pel w/o · axial · non-contrast · 0.88mm/px · z∈[+1101,+1536]mm · 13 of 99 slices shown, 15 images]
[im 6/99  soft-tissue]
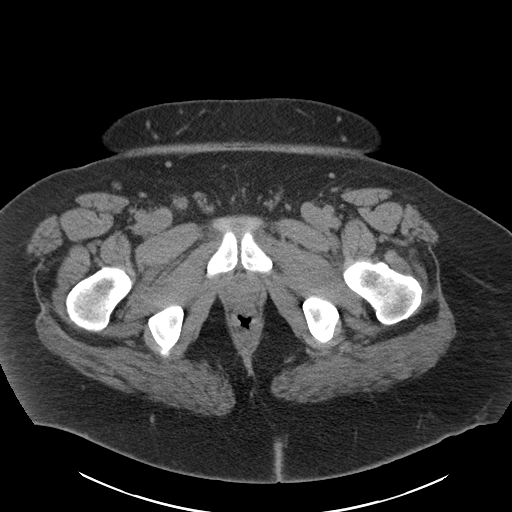
[im 6/99  bone]
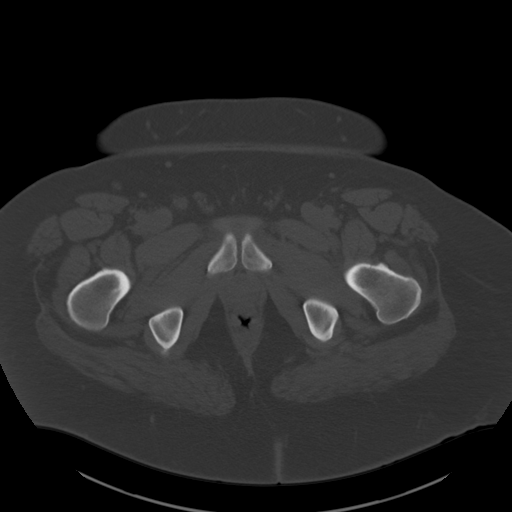
[im 12/99  soft-tissue]
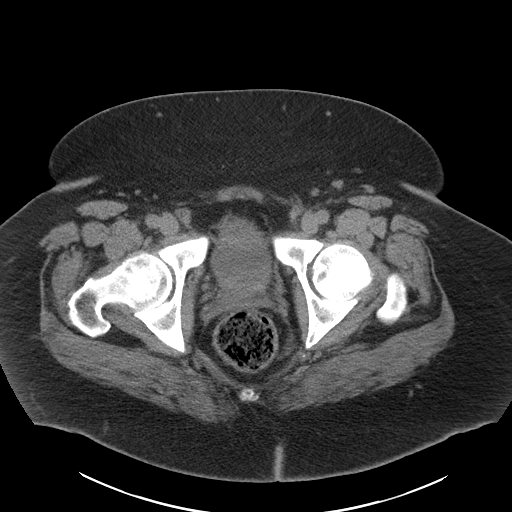
[im 24/99  soft-tissue]
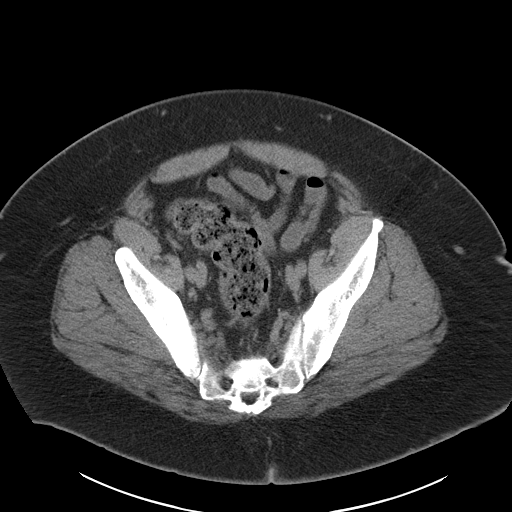
[im 29/99  soft-tissue]
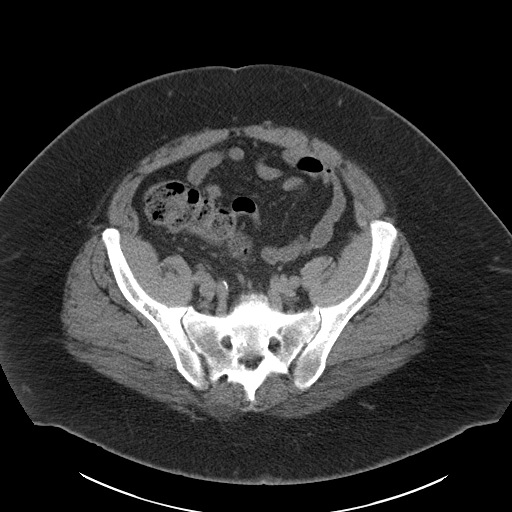
[im 35/99  soft-tissue]
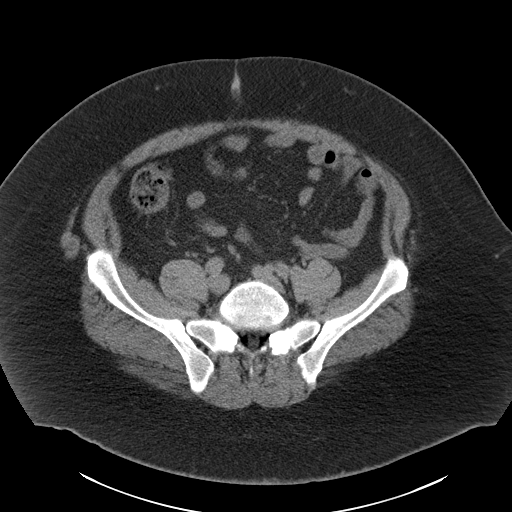
[im 41/99  soft-tissue]
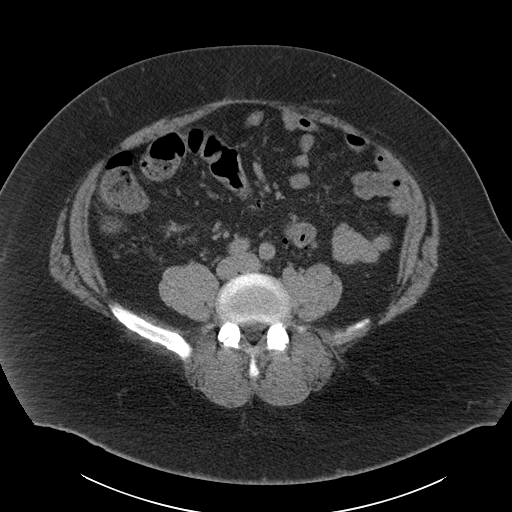
[im 52/99  soft-tissue]
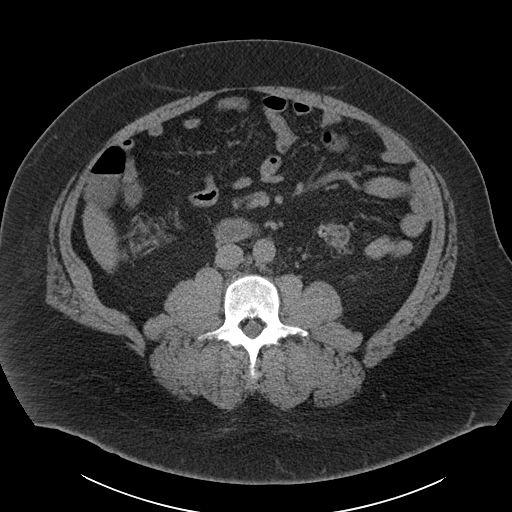
[im 58/99  soft-tissue]
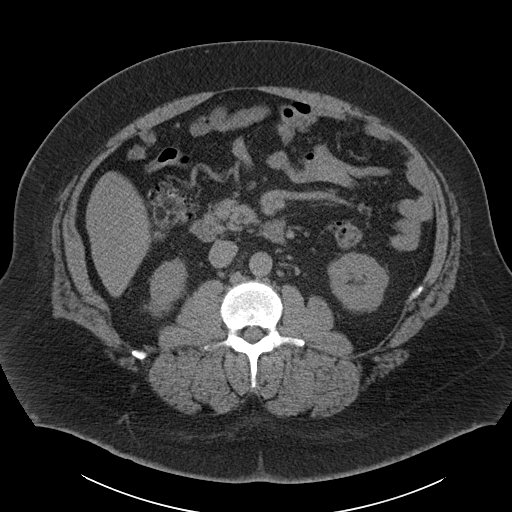
[im 64/99  soft-tissue]
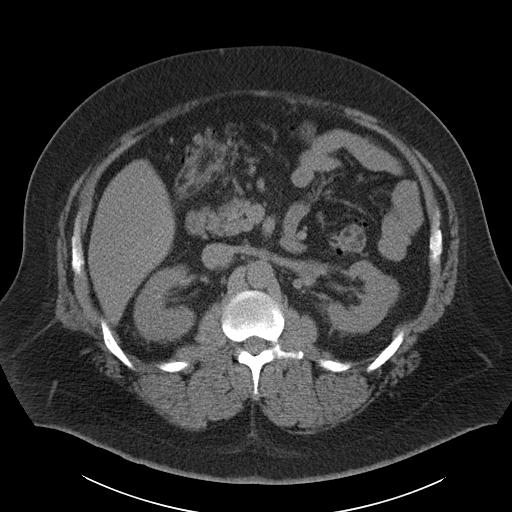
[im 64/99  bone]
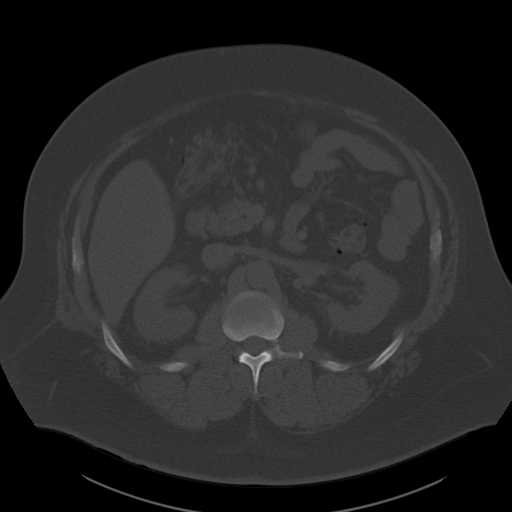
[im 70/99  soft-tissue]
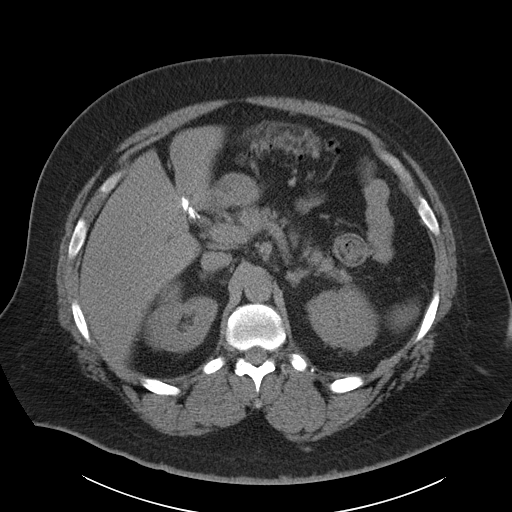
[im 75/99  soft-tissue]
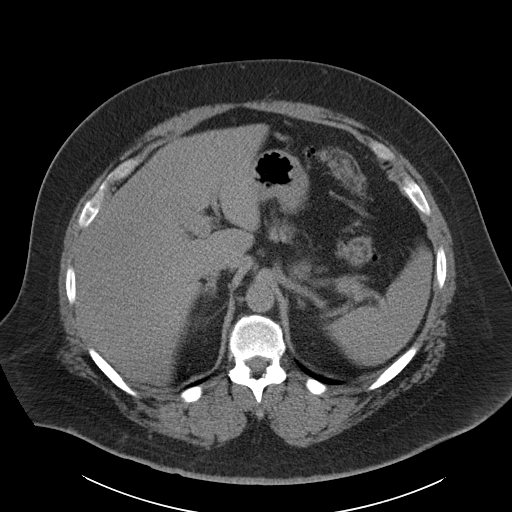
[im 87/99  soft-tissue]
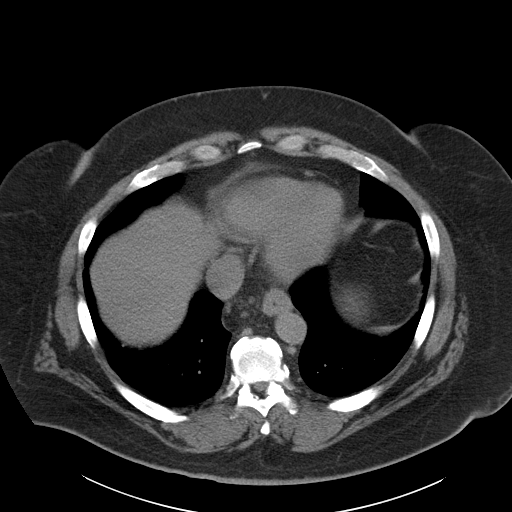
[im 93/99  soft-tissue]
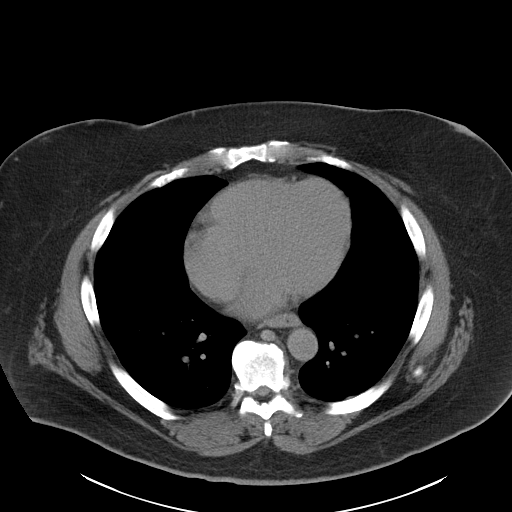

[Series 5: coronal · coronal · 0.90mm/px · 3 of 182 slices shown]
[im 61/182  soft-tissue]
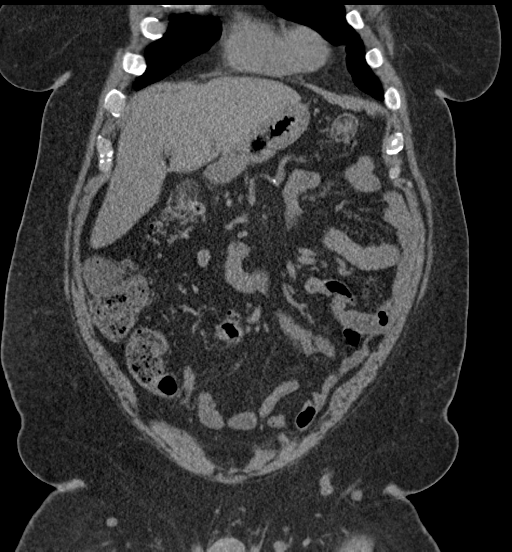
[im 81/182  soft-tissue]
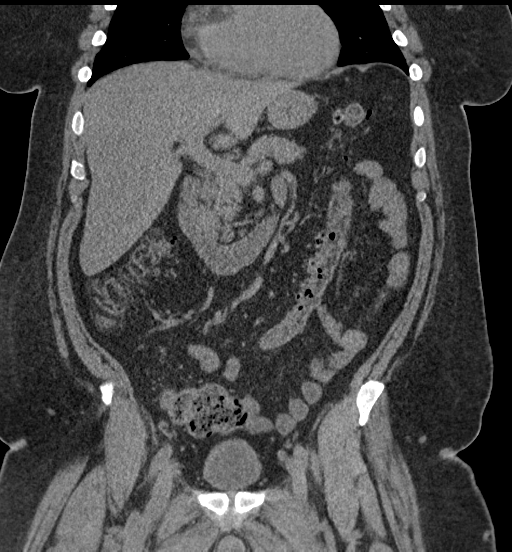
[im 101/182  soft-tissue]
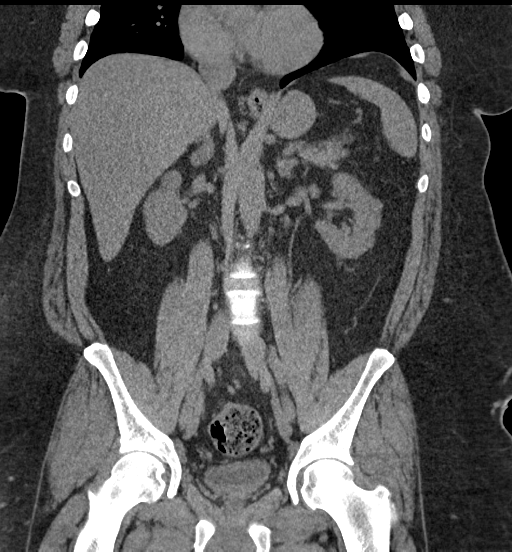

[16 of 46 positions shown; findings below may reference images not displayed]

FINDINGS: Lower chest: The lung bases are clear. No pleural or pericardial
effusion.

Hepatobiliary: There is no focal liver abnormality. Previous
cholecystectomy. No biliary dilatation.

Pancreas: The pancreas is unremarkable.

Spleen: Normal appearance of the spleen.

Adrenals/Urinary Tract: The adrenal glands are normal. Both kidneys
are unremarkable. No kidney stone or hydronephrosis. Urinary bladder
appears unremarkable.

Stomach/Bowel: The stomach is normal. The small bowel loops have a
normal course and caliber. The appendix is not visualized. Wall
thickening from the ascending colon through the transverse colon up
to the distal descending colon is identified compatible with
colitis. Favor inflammatory or infectious in etiology. Although
there are numerous diverticulae identified arising from throughout
the colon this does not have the typical appearance of
diverticulitis. No evidence to suggest bowel perforation. No
pneumatosis identified. Unremarkable appearance of the sigmoid colon
and rectum.

Vascular/Lymphatic: Mild aortic atherosclerosis. No branch vessel
disease. No aneurysm. No upper abdominal adenopathy. No pelvic or
inguinal adenopathy.

Reproductive: Prostate is unremarkable.

Other: There is a small periumbilical hernia which contains fat
only. There is no free fluid or fluid collections identified within
the abdomen or the pelvis.

Musculoskeletal: Nonspecific sclerotic lesion within the T11
vertebra is noted measuring 9 mm. In the absence of a known
malignancy favor a benign bone island.
IMPRESSION: 1. Abnormal wall thickening of the ascending colon through the
distal descending colon is identified compatible with colitis. Favor
inflammatory or infectious in etiology. No complicating features
identified.
2. Numerous colonic diverticula identified without features of acute
diverticulitis.
3.  Aortic Atherosclerosis ([id number]-LB7.7).

## 2024-01-10 DEATH — deceased
